# Patient Record
Sex: Male | Born: 1965
Health system: Southern US, Community
[De-identification: ages and names within clinical notes are randomized; demographics above are authoritative.]

## PROBLEM LIST (undated history)

## (undated) DIAGNOSIS — F419 Anxiety disorder, unspecified: Secondary | ICD-10-CM

## (undated) DIAGNOSIS — I10 Essential (primary) hypertension: Secondary | ICD-10-CM

## (undated) DIAGNOSIS — F32A Depression, unspecified: Secondary | ICD-10-CM

## (undated) DIAGNOSIS — E785 Hyperlipidemia, unspecified: Secondary | ICD-10-CM

## (undated) DIAGNOSIS — N390 Urinary tract infection, site not specified: Secondary | ICD-10-CM

## (undated) DIAGNOSIS — E119 Type 2 diabetes mellitus without complications: Secondary | ICD-10-CM

## (undated) DIAGNOSIS — K219 Gastro-esophageal reflux disease without esophagitis: Secondary | ICD-10-CM

## (undated) DIAGNOSIS — F329 Major depressive disorder, single episode, unspecified: Secondary | ICD-10-CM

## (undated) DIAGNOSIS — T7840XA Allergy, unspecified, initial encounter: Secondary | ICD-10-CM

## (undated) HISTORY — DX: Gastro-esophageal reflux disease without esophagitis: K21.9

## (undated) HISTORY — PX: TONSILLECTOMY AND ADENOIDECTOMY: SUR1326

## (undated) HISTORY — DX: Urinary tract infection, site not specified: N39.0

## (undated) HISTORY — DX: Hyperlipidemia, unspecified: E78.5

## (undated) HISTORY — PX: OPEN ANTERIOR SHOULDER RECONSTRUCTION: SHX2100

## (undated) HISTORY — DX: Type 2 diabetes mellitus without complications: E11.9

## (undated) HISTORY — DX: Essential (primary) hypertension: I10

## (undated) HISTORY — DX: Allergy, unspecified, initial encounter: T78.40XA

## (undated) HISTORY — DX: Anxiety disorder, unspecified: F41.9

## (undated) HISTORY — DX: Depression, unspecified: F32.A

---
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
## 1998-02-18 HISTORY — PX: SEPTOPLASTY: SUR1290

## 2008-09-01 ENCOUNTER — Encounter (INDEPENDENT_AMBULATORY_CARE_PROVIDER_SITE_OTHER): Payer: Self-pay | Admitting: Internal Medicine

## 2008-09-01 ENCOUNTER — Ambulatory Visit (HOSPITAL_COMMUNITY): Admission: RE | Admit: 2008-09-01 | Discharge: 2008-09-01 | Payer: Self-pay | Admitting: Internal Medicine

## 2010-06-03 ENCOUNTER — Emergency Department (HOSPITAL_COMMUNITY)
Admission: EM | Admit: 2010-06-03 | Discharge: 2010-06-03 | Disposition: A | Discharge status: Home / Self Care | Payer: PRIVATE HEALTH INSURANCE | Attending: Emergency Medicine | Admitting: Emergency Medicine

## 2010-06-03 ENCOUNTER — Emergency Department (HOSPITAL_COMMUNITY): Payer: PRIVATE HEALTH INSURANCE

## 2010-06-03 DIAGNOSIS — Z79899 Other long term (current) drug therapy: Secondary | ICD-10-CM | POA: Insufficient documentation

## 2010-06-03 DIAGNOSIS — Z7982 Long term (current) use of aspirin: Secondary | ICD-10-CM | POA: Insufficient documentation

## 2010-06-03 DIAGNOSIS — S4980XA Other specified injuries of shoulder and upper arm, unspecified arm, initial encounter: Secondary | ICD-10-CM | POA: Insufficient documentation

## 2010-06-03 DIAGNOSIS — X500XXA Overexertion from strenuous movement or load, initial encounter: Secondary | ICD-10-CM | POA: Insufficient documentation

## 2010-06-03 DIAGNOSIS — Y990 Civilian activity done for income or pay: Secondary | ICD-10-CM | POA: Insufficient documentation

## 2010-06-03 DIAGNOSIS — Y9210 Unspecified residential institution as the place of occurrence of the external cause: Secondary | ICD-10-CM | POA: Insufficient documentation

## 2010-06-03 DIAGNOSIS — M25519 Pain in unspecified shoulder: Secondary | ICD-10-CM | POA: Insufficient documentation

## 2010-06-03 DIAGNOSIS — S46909A Unspecified injury of unspecified muscle, fascia and tendon at shoulder and upper arm level, unspecified arm, initial encounter: Secondary | ICD-10-CM | POA: Insufficient documentation

## 2010-06-03 DIAGNOSIS — I10 Essential (primary) hypertension: Secondary | ICD-10-CM | POA: Insufficient documentation

## 2010-06-03 DIAGNOSIS — IMO0002 Reserved for concepts with insufficient information to code with codable children: Secondary | ICD-10-CM | POA: Insufficient documentation

## 2010-07-03 NOTE — Op Note (Signed)
NAME:  Nicholas Ward, Nicholas Ward NO.:  [id number]   MEDICAL RECORD NO.:  [id number]          PATIENT TYPE:  AMB   LOCATION:  DAY                           FACILITY:  [facility]   PHYSICIAN:  Lionel December, M.D.    DATE OF BIRTH:  1965-12-07   DATE OF PROCEDURE:  09/01/2008  DATE OF DISCHARGE:  09/01/2008                                PROCEDURE NOTE   PROCEDURE:  Colonoscopy.   INDICATION:  Nicholas Ward is a 45 year old Caucasian male who has been  experiencing intermittent hematochezia over the last few months.  He has  not experienced any change in his bowel habits.  Family history is very  significant.  His mother had colon carcinoma at age 64 and had a second  probably 20 years later needing APR.  His father died of carcinoid.  Procedure and risks were reviewed with the patient and informed consent  was obtained.   MEDICATIONS FOR CONSCIOUS SEDATION:  Demerol 50 mg IV and Versed 6 mg IV  in divided doses.   FINDINGS:  Procedure performed in endoscopy suite.  The patient's vital  signs and O2 sats were monitored during the procedure and remained  stable.  The patient was placed in left lateral recumbent position and  rectal examination performed.  No abnormality noted on external or  digital exam.  Pentax videoscope was placed through the rectum and  advanced under vision into sigmoid colon and beyond.  Preparation was  excellent.  The scope was passed into cecum and which was identified by  appendiceal orifice and ileocecal valve.  Short segment of TI was also  examined and it was normal.  Colonic mucosa was carefully examined on  the way out.  There was a small polyp at the descending colon, which was  ablated via cold biopsy.  Mucosa and rest of the colon was normal.  Rectal mucosa similarly was normal.  The scope was retroflexed to  examine anorectal junction, and small hemorrhoids were noted below the  dentate line.  Endoscope was straightened and withdrawn.  Withdrawal  time  was 8 minutes.   The patient tolerated the procedure well.   FINAL DIAGNOSES:  1. Normal terminal ileum.  2. Small polyps ablated via cold biopsy from descending colon.  3. Small external hemorrhoids felt to be the source of intermittent      hematochezia.   RECOMMENDATIONS:  Standard instructions given.   I will be contacting the patient with the results of biopsy.  Given his  family history, he will need to return for his next exam in 5 years.      Lionel December, M.D.  Electronically Signed     NR/MEDQ  D:  09/01/2008  T:  09/02/2008  Job:  [id number]   cc:   Doreen Beam, MD  Fax: [phone]

## 2010-08-31 ENCOUNTER — Other Ambulatory Visit (HOSPITAL_COMMUNITY): Payer: Self-pay | Admitting: Orthopedic Surgery

## 2010-08-31 DIAGNOSIS — R52 Pain, unspecified: Secondary | ICD-10-CM

## 2010-09-04 ENCOUNTER — Other Ambulatory Visit (HOSPITAL_COMMUNITY): Payer: Self-pay | Admitting: Orthopedic Surgery

## 2010-09-04 ENCOUNTER — Other Ambulatory Visit (HOSPITAL_COMMUNITY): Payer: Self-pay

## 2010-09-04 ENCOUNTER — Ambulatory Visit (HOSPITAL_COMMUNITY)
Admission: RE | Admit: 2010-09-04 | Discharge: 2010-09-04 | Disposition: A | Discharge status: Home / Self Care | Payer: PRIVATE HEALTH INSURANCE | Source: Ambulatory Visit | Attending: Orthopedic Surgery | Admitting: Orthopedic Surgery

## 2010-09-04 DIAGNOSIS — R52 Pain, unspecified: Secondary | ICD-10-CM

## 2010-09-04 DIAGNOSIS — M25519 Pain in unspecified shoulder: Secondary | ICD-10-CM | POA: Insufficient documentation
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
## 2010-09-18 ENCOUNTER — Other Ambulatory Visit (HOSPITAL_COMMUNITY): Payer: Self-pay

## 2010-09-20 ENCOUNTER — Encounter (HOSPITAL_COMMUNITY)
Admission: RE | Admit: 2010-09-20 | Discharge: 2010-09-20 | Disposition: A | Discharge status: Home / Self Care | Payer: 59 | Source: Ambulatory Visit | Attending: Orthopedic Surgery | Admitting: Orthopedic Surgery

## 2010-09-20 DIAGNOSIS — Z01818 Encounter for other preprocedural examination: Secondary | ICD-10-CM | POA: Insufficient documentation

## 2010-09-20 LAB — CBC
MCV: 89.1 fL (ref 78.0–100.0)
Platelets: 279 10*3/uL (ref 150–400)
RDW: 12 % (ref 11.5–15.5)
WBC: 7.5 10*3/uL (ref 4.0–10.5)

## 2010-09-20 LAB — BASIC METABOLIC PANEL
Chloride: 100 mEq/L (ref 96–112)
Creatinine, Ser: 0.92 mg/dL (ref 0.50–1.35)
GFR calc Af Amer: >60 mL/min (ref >60)

## 2010-09-20 LAB — SURGICAL PCR SCREEN: MRSA, PCR: POSITIVE — AB

## 2010-09-21 ENCOUNTER — Other Ambulatory Visit (HOSPITAL_COMMUNITY): Payer: Self-pay

## 2010-09-28 ENCOUNTER — Ambulatory Visit (HOSPITAL_COMMUNITY)
Admission: RE | Admit: 2010-09-28 | Discharge: 2010-09-28 | Disposition: A | Discharge status: Home / Self Care | Payer: 59 | Source: Ambulatory Visit | Attending: Orthopedic Surgery | Admitting: Orthopedic Surgery

## 2010-09-28 DIAGNOSIS — S43429A Sprain of unspecified rotator cuff capsule, initial encounter: Secondary | ICD-10-CM | POA: Insufficient documentation

## 2010-09-28 DIAGNOSIS — M25819 Other specified joint disorders, unspecified shoulder: Secondary | ICD-10-CM | POA: Insufficient documentation

## 2010-09-28 DIAGNOSIS — S46919A Strain of unspecified muscle, fascia and tendon at shoulder and upper arm level, unspecified arm, initial encounter: Secondary | ICD-10-CM | POA: Insufficient documentation

## 2010-09-28 DIAGNOSIS — I10 Essential (primary) hypertension: Secondary | ICD-10-CM | POA: Insufficient documentation

## 2010-09-28 DIAGNOSIS — S46819A Strain of other muscles, fascia and tendons at shoulder and upper arm level, unspecified arm, initial encounter: Secondary | ICD-10-CM | POA: Insufficient documentation

## 2010-09-28 DIAGNOSIS — M19019 Primary osteoarthritis, unspecified shoulder: Secondary | ICD-10-CM | POA: Insufficient documentation

## 2010-09-28 DIAGNOSIS — M24119 Other articular cartilage disorders, unspecified shoulder: Secondary | ICD-10-CM | POA: Insufficient documentation
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
## 2013-08-24 ENCOUNTER — Encounter (INDEPENDENT_AMBULATORY_CARE_PROVIDER_SITE_OTHER): Payer: Self-pay | Admitting: *Deleted

## 2014-04-21 ENCOUNTER — Encounter: Payer: Self-pay | Admitting: Family

## 2014-04-21 ENCOUNTER — Ambulatory Visit (INDEPENDENT_AMBULATORY_CARE_PROVIDER_SITE_OTHER): Payer: 59 | Admitting: Family

## 2014-04-21 ENCOUNTER — Other Ambulatory Visit (INDEPENDENT_AMBULATORY_CARE_PROVIDER_SITE_OTHER): Payer: 59

## 2014-04-21 VITALS — BP 140/92 | HR 62 | Temp 98.0°F | Resp 18 | Ht 69.0 in | Wt 193.8 lb

## 2014-04-21 DIAGNOSIS — F418 Other specified anxiety disorders: Secondary | ICD-10-CM

## 2014-04-21 DIAGNOSIS — I10 Essential (primary) hypertension: Secondary | ICD-10-CM | POA: Insufficient documentation

## 2014-04-21 DIAGNOSIS — E119 Type 2 diabetes mellitus without complications: Secondary | ICD-10-CM

## 2014-04-21 DIAGNOSIS — E785 Hyperlipidemia, unspecified: Secondary | ICD-10-CM

## 2014-04-21 DIAGNOSIS — Z716 Tobacco abuse counseling: Secondary | ICD-10-CM

## 2014-04-21 DIAGNOSIS — F329 Major depressive disorder, single episode, unspecified: Secondary | ICD-10-CM

## 2014-04-21 DIAGNOSIS — F419 Anxiety disorder, unspecified: Secondary | ICD-10-CM

## 2014-04-21 LAB — LIPID PANEL
CHOL/HDL RATIO: 4
CHOLESTEROL: 173 mg/dL (ref 0–200)
HDL: 42.6 mg/dL (ref >39.00)
LDL Cholesterol: 100 mg/dL — ABNORMAL HIGH (ref 0–99)
NonHDL: 130.4
TRIGLYCERIDES: 154 mg/dL — AB (ref 0.0–149.0)
VLDL: 30.8 mg/dL (ref 0.0–40.0)

## 2014-04-21 LAB — BASIC METABOLIC PANEL
BUN: 12 mg/dL (ref 6–23)
CHLORIDE: 103 meq/L (ref 96–112)
CO2: 29 mEq/L (ref 19–32)
Calcium: 9.3 mg/dL (ref 8.4–10.5)
Creatinine, Ser: 0.91 mg/dL (ref 0.40–1.50)
GFR: 95.12 mL/min (ref >60.00)
Glucose, Bld: 125 mg/dL — ABNORMAL HIGH (ref 70–99)
POTASSIUM: 4.9 meq/L (ref 3.5–5.1)
SODIUM: 137 meq/L (ref 135–145)

## 2014-04-21 LAB — TESTOSTERONE: TESTOSTERONE: 347.15 ng/dL (ref 300.00–890.00)

## 2014-04-21 LAB — HEMOGLOBIN A1C: Hgb A1c MFr Bld: 5.9 % (ref 4.6–6.5)

## 2014-04-21 MED ORDER — BUPROPION HCL ER (SR) 150 MG PO TB12: ORAL_TABLET | ORAL | Status: DC

## 2014-04-21 MED ORDER — ALPRAZOLAM 0.25 MG PO TABS: 0.2500 mg | ORAL_TABLET | Freq: Two times a day (BID) | ORAL | Status: DC | PRN

## 2014-04-21 NOTE — Assessment & Plan Note (Signed)
Stable with no medication at this time. Continue management with current lifestyle behaviors.

## 2014-04-21 NOTE — Assessment & Plan Note (Signed)
Currently maintained with supplements. Obtain new lipid profile. Continue current dosage of supplements. Changes pending lab results.

## 2014-04-21 NOTE — Progress Notes (Signed)
Pre visit review using our clinic review tool, if applicable. No additional management support is needed unless otherwise documented below in the visit note. 

## 2014-04-21 NOTE — Assessment & Plan Note (Addendum)
Previous A1c was 5.6. Obtain new A1c. Discussed preventative tests including eye exams, foot exams, and kidney function checked. Appears well controlled at this time. Follow-up pending A1c results. Patient also notes some mild erectile dysfunction. Obtain testosterone to rule out hormonal cause.

## 2014-04-21 NOTE — Assessment & Plan Note (Addendum)
Patient describes experiencing anxiety and depression with increased episodes lately. He has noted some improvements without medication, however he would like some medication to assist in his depression and anxiety. Start Wellbutrin. Start Xanax as needed for anxiety. Denies any suicidal ideations. Follow-up in one month

## 2014-04-21 NOTE — Assessment & Plan Note (Signed)
Discussed various options for quitting using the vague and all tobacco. Patient wishes to pursue Wellbutrin to supplement. Start Wellbutrin. Patient instructed to take 150 mg once daily for 3 days and then increase to twice daily for 7-12 weeks. He will stop all tobacco or nicotine in approximately one week from starting date.

## 2014-04-21 NOTE — Patient Instructions (Signed)
Thank you for choosing [facility].  Summary/Instructions:  Your prescription(s) have been submitted to your pharmacy or been printed and provided for you. Please take as directed and contact our office if you believe you are having problem(s) with the medication(s) or have any questions.  Please stop by the lab on the basement level of the building for your blood work. Your results will be released to [facility] (or called to you) after review, usually within 72 hours after test completion. If any changes need to be made, you will be notified at that same time.  If your symptoms worsen or fail to improve, please contact our office for further instruction, or in case of emergency go directly to the emergency room at the closest medical facility.    Bupropion extended-release tablets (Depression/Mood Disorders) What is this medicine? BUPROPION (byoo PROE pee on) is used to treat depression. This medicine may be used for other purposes; ask your health care provider or pharmacist if you have questions. COMMON BRAND NAME(S): Aplenzin, Budeprion XL, Forfivo XL, Wellbutrin XL What should I tell my health care provider before I take this medicine? They need to know if you have any of these conditions: -an eating disorder, such as anorexia or bulimia -bipolar disorder or psychosis -diabetes or high blood sugar, treated with medication -glaucoma -head injury or brain tumor -heart disease, previous heart attack, or irregular heart beat -high blood pressure -kidney or liver disease -seizures (convulsions) -suicidal thoughts or a previous suicide attempt -Tourette's syndrome -weight loss -an unusual or allergic reaction to bupropion, other medicines, foods, dyes, or preservatives -breast-feeding -pregnant or trying to become pregnant How should I use this medicine? Take this medicine by mouth with a glass of water. Follow the directions on the prescription label. You can take it with or  without food. If it upsets your stomach, take it with food. Do not crush, chew, or cut these tablets. This medicine is taken once daily at the same time each day. Do not take your medicine more often than directed. Do not stop taking this medicine suddenly except upon the advice of your doctor. Stopping this medicine too quickly may cause serious side effects or your condition may worsen. A special MedGuide will be given to you by the pharmacist with each prescription and refill. Be sure to read this information carefully each time. Talk to your pediatrician regarding the use of this medicine in children. Special care may be needed. Overdosage: If you think you have taken too much of this medicine contact a poison control center or emergency room at once. NOTE: This medicine is only for you. Do not share this medicine with others. What if I miss a dose? If you miss a dose, skip the missed dose and take your next tablet at the regular time. Do not take double or extra doses. What may interact with this medicine? Do not take this medicine with any of the following medications: -linezolid -MAOIs like Azilect, Carbex, Eldepryl, Marplan, Nardil, and Parnate -methylene blue (injected into a vein) -other medicines that contain bupropion like Zyban This medicine may also interact with the following medications: -alcohol -certain medicines for anxiety or sleep -certain medicines for blood pressure like metoprolol, propranolol -certain medicines for depression or psychotic disturbances -certain medicines for HIV or AIDS like efavirenz, lopinavir, nelfinavir, ritonavir -certain medicines for irregular heart beat like propafenone, flecainide -certain medicines for Parkinson's disease like amantadine, levodopa -certain medicines for seizures like carbamazepine, phenytoin, phenobarbital -cimetidine -clopidogrel -cyclophosphamide -furazolidone -isoniazid -  nicotine -orphenadrine -procarbazine -steroid  medicines like prednisone or cortisone -stimulant medicines for attention disorders, weight loss, or to stay awake -tamoxifen -theophylline -thiotepa -ticlopidine -tramadol -warfarin This list may not describe all possible interactions. Give your health care provider a list of all the medicines, herbs, non-prescription drugs, or dietary supplements you use. Also tell them if you smoke, drink alcohol, or use illegal drugs. Some items may interact with your medicine. What should I watch for while using this medicine? Tell your doctor if your symptoms do not get better or if they get worse. Visit your doctor or health care professional for regular checks on your progress. Because it may take several weeks to see the full effects of this medicine, it is important to continue your treatment as prescribed by your doctor. Patients and their families should watch out for new or worsening thoughts of suicide or depression. Also watch out for sudden changes in feelings such as feeling anxious, agitated, panicky, irritable, hostile, aggressive, impulsive, severely restless, overly excited and hyperactive, or not being able to sleep. If this happens, especially at the beginning of treatment or after a change in dose, call your health care professional. Avoid alcoholic drinks while taking this medicine. Drinking large amounts of alcoholic beverages, using sleeping or anxiety medicines, or quickly stopping the use of these agents while taking this medicine may increase your risk for a seizure. Do not drive or use heavy machinery until you know how this medicine affects you. This medicine can impair your ability to perform these tasks. Do not take this medicine close to bedtime. It may prevent you from sleeping. Your mouth may get dry. Chewing sugarless gum or sucking hard candy, and drinking plenty of water may help. Contact your doctor if the problem does not go away or is severe. The tablet shell for some brands  of this medicine does not dissolve. This is normal. The tablet shell may appear whole in the stool. This is not a cause for concern. What side effects may I notice from receiving this medicine? Side effects that you should report to your doctor or health care professional as soon as possible: -allergic reactions like skin rash, itching or hives, swelling of the face, lips, or tongue -breathing problems -changes in vision -confusion -fast or irregular heartbeat -hallucinations -increased blood pressure -redness, blistering, peeling or loosening of the skin, including inside the mouth -seizures -suicidal thoughts or other mood changes -unusually weak or tired -vomiting Side effects that usually do not require medical attention (report to your doctor or health care professional if they continue or are bothersome): -change in sex drive or performance -constipation -headache -loss of appetite -nausea -tremors -weight loss This list may not describe all possible side effects. Call your doctor for medical advice about side effects. You may report side effects to FDA at 1-800-FDA-1088. Where should I keep my medicine? Keep out of the reach of children. Store at room temperature between 15 and 30 degrees C (59 and 86 degrees F). Throw away any unused medicine after the expiration date. NOTE: This sheet is a summary. It may not cover all possible information. If you have questions about this medicine, talk to your doctor, pharmacist, or health care provider.  2015, Elsevier/Gold Standard. (2012-08-28 12:39:42)

## 2014-04-21 NOTE — Progress Notes (Signed)
Subjective:    Patient ID: Nicholas Ward, male    DOB: 06-28-1965, 49 y.o.   MRN: [id number]  Chief Complaint  Patient presents with  . Establish Care    HPI:  Nicholas Ward is a 49 y.o. male who presents today to establish care and discuss    1) Depression - Associated symptom of depressed mood has been going on for a couple of years, with an exacerbation in January when his wife was admitted to the hospital. Indicates that he has family and work concerns. Currently seeing a counselor regarding relationship.   2) Diabetes - Previously diagnosed and not currently treated with any medication. Previous A1c was 5.6 at an outside facility. Goal was to work on lifestyle changes. Last eye exam up to date.   3) Hyperlipidemia - Indicates his cholesterol was labile and was going to start a statin. Currently treated with red yeast rice and fish oil.   4) Tobacco use - interested in quitting smoking.    No Known Allergies   No current outpatient prescriptions on file prior to visit.   No current facility-administered medications on file prior to visit.    Past Medical History  Diagnosis Date  . Hypertension   . Diabetes mellitus without complication   . Hyperlipidemia   . Urinary tract infection     Past Surgical History  Procedure Laterality Date  . Tonsillectomy and adenoidectomy    . Open anterior shoulder reconstruction    . Septoplasty  2000    Family History  Problem Relation Age of Onset  . Colon cancer Mother   . Hyperlipidemia Mother   . Heart disease Father   . Hypertension Father   . Diabetes Father   . Lung cancer Maternal Grandmother   . Alcohol abuse Maternal Grandfather   . Alcohol abuse Paternal Grandfather     History   Social History  . Marital Status: Married    Spouse Name: N/A  . Number of Children: 2  . Years of Education: 16   Occupational History  . Registered Nurse    Social History Main Topics  . Smoking status: Former  Smoker -- 0.25 packs/day for 18 years    Types: Cigarettes  . Smokeless tobacco: Never Used  . Alcohol Use: No  . Drug Use: No  . Sexual Activity: Not on file   Other Topics Concern  . Not on file   Social History Narrative   Currently lives with his wife and children. 3 cats, 2 dogs; Fun: martial arts, motorcycle   Denies any religious beliefs effecting healthcare.     Review of Systems  Eyes:       Negative for changes in vision.  Respiratory: Negative for chest tightness and shortness of breath.   Cardiovascular: Negative for chest pain, palpitations and leg swelling.  Psychiatric/Behavioral: Positive for sleep disturbance and dysphoric mood. Negative for suicidal ideas. The patient is nervous/anxious.       Objective:    BP 140/92 mmHg  Pulse 62  Temp(Src) 98 F (36.7 C) (Oral)  Resp 18  Ht 5\' 9"  (1.753 m)  Wt 193 lb 12.8 oz (87.907 kg)  BMI 28.61 kg/m2  SpO2 98% Nursing note and vital signs reviewed.  Physical Exam  Constitutional: He is oriented to person, place, and time. He appears well-developed and well-nourished. No distress.  Cardiovascular: Normal rate, regular rhythm, normal heart sounds and intact distal pulses.   Pulmonary/Chest: Effort normal and breath sounds normal.  Neurological: He is alert and oriented to person, place, and time.  Skin: Skin is warm and dry.  Psychiatric: He has a normal mood and affect. His behavior is normal. Judgment and thought content normal.       Assessment & Plan:

## 2014-04-22 ENCOUNTER — Telehealth: Payer: Self-pay | Admitting: Family

## 2014-04-22 ENCOUNTER — Encounter: Payer: Self-pay | Admitting: Family

## 2014-04-22 NOTE — Telephone Encounter (Signed)
Pt aware of results. Will send them in the mail

## 2014-04-22 NOTE — Telephone Encounter (Signed)
Please inform the patient that his lab work looks good. All labs were within the normal limits including testosterone.   Lab Results  Component Value Date   HGBA1C 5.9 04/21/2014   Lab Results  Component Value Date   CHOL 173 04/21/2014   HDL 42.60 04/21/2014   LDLCALC 100* 04/21/2014   TRIG 154.0* 04/21/2014   CHOLHDL 4 04/21/2014

## 2014-05-17 ENCOUNTER — Ambulatory Visit: Payer: Self-pay | Admitting: Family

## 2014-07-07 ENCOUNTER — Other Ambulatory Visit: Payer: Self-pay | Admitting: Family

## 2014-07-07 MED ORDER — ALPRAZOLAM 0.25 MG PO TABS: 0.2500 mg | ORAL_TABLET | Freq: Two times a day (BID) | ORAL | Status: DC | PRN

## 2014-07-07 NOTE — Addendum Note (Signed)
Addended by: Mauricio Po D on: 07/07/2014 12:49 PM   Modules accepted: Orders

## 2014-07-14 MED ORDER — BUPROPION HCL ER (SR) 150 MG PO TB12: 150.0000 mg | ORAL_TABLET | Freq: Two times a day (BID) | ORAL | Status: DC

## 2014-07-14 NOTE — Addendum Note (Signed)
Addended by: Earnstine Regal on: 07/14/2014 10:39 AM   Modules accepted: Orders

## 2014-11-02 ENCOUNTER — Other Ambulatory Visit: Payer: Self-pay | Admitting: Family

## 2014-11-02 MED ORDER — ALPRAZOLAM 0.25 MG PO TABS: 0.2500 mg | ORAL_TABLET | Freq: Two times a day (BID) | ORAL | Status: DC | PRN

## 2014-11-02 MED ORDER — BUPROPION HCL ER (SR) 150 MG PO TB12: 150.0000 mg | ORAL_TABLET | Freq: Two times a day (BID) | ORAL | Status: DC

## 2014-11-04 ENCOUNTER — Other Ambulatory Visit: Payer: Self-pay

## 2015-01-18 ENCOUNTER — Other Ambulatory Visit: Payer: Self-pay | Admitting: Family

## 2015-01-18 MED ORDER — BUPROPION HCL ER (SR) 150 MG PO TB12: 150.0000 mg | ORAL_TABLET | Freq: Two times a day (BID) | ORAL | Status: DC

## 2015-01-18 MED ORDER — ALPRAZOLAM 0.25 MG PO TABS: 0.2500 mg | ORAL_TABLET | Freq: Two times a day (BID) | ORAL | Status: DC | PRN

## 2015-01-18 NOTE — Addendum Note (Signed)
Addended by: Mauricio Po D on: 01/18/2015 09:54 PM   Modules accepted: Orders

## 2015-03-21 ENCOUNTER — Other Ambulatory Visit: Payer: Self-pay | Admitting: Family

## 2015-03-21 MED ORDER — BUPROPION HCL ER (SR) 150 MG PO TB12: 150.0000 mg | ORAL_TABLET | Freq: Two times a day (BID) | ORAL | Status: DC

## 2015-03-21 NOTE — Addendum Note (Signed)
Addended by: Earnstine Regal on: 03/21/2015 03:36 PM   Modules accepted: Orders

## 2015-03-31 DIAGNOSIS — E1165 Type 2 diabetes mellitus with hyperglycemia: Secondary | ICD-10-CM | POA: Diagnosis not present

## 2015-08-03 ENCOUNTER — Other Ambulatory Visit: Payer: Self-pay | Admitting: Family

## 2015-08-03 NOTE — Addendum Note (Signed)
Addended by: Biagio Borg on: 08/03/2015 05:28 PM   Modules accepted: Orders

## 2015-08-03 NOTE — Telephone Encounter (Signed)
Ok to forward to PCP 

## 2015-08-04 MED ORDER — BUPROPION HCL ER (SR) 150 MG PO TB12: 150.0000 mg | ORAL_TABLET | Freq: Two times a day (BID) | ORAL | Status: DC

## 2015-08-04 MED ORDER — ALPRAZOLAM 0.25 MG PO TABS: 0.2500 mg | ORAL_TABLET | Freq: Two times a day (BID) | ORAL | Status: DC | PRN

## 2015-08-04 NOTE — Addendum Note (Signed)
Addended by: Mauricio Po D on: 08/04/2015 10:55 AM   Modules accepted: Orders

## 2015-08-08 DIAGNOSIS — E1165 Type 2 diabetes mellitus with hyperglycemia: Secondary | ICD-10-CM | POA: Diagnosis not present

## 2015-11-29 ENCOUNTER — Encounter: Payer: Self-pay | Admitting: Family

## 2015-11-29 ENCOUNTER — Other Ambulatory Visit: Payer: Self-pay | Admitting: Family

## 2015-11-29 MED ORDER — ALPRAZOLAM 0.25 MG PO TABS: 0.2500 mg | ORAL_TABLET | Freq: Two times a day (BID) | ORAL | 0 refills | Status: DC | PRN

## 2015-12-04 ENCOUNTER — Other Ambulatory Visit: Payer: Self-pay | Admitting: Family

## 2015-12-07 NOTE — Telephone Encounter (Signed)
Rec'd fax requesting refilll on alprazolam. Per chart med was APPROVED FAXED BACK ON 10/11. Called pharmacy spokw w/susan advise if refill was received on 10/11, and which she states they did not received. Gave verbal request from 10/11 to fill alprazolam.../lmb

## 2016-02-05 DIAGNOSIS — Z299 Encounter for prophylactic measures, unspecified: Secondary | ICD-10-CM | POA: Diagnosis not present

## 2016-02-05 DIAGNOSIS — Z713 Dietary counseling and surveillance: Secondary | ICD-10-CM | POA: Diagnosis not present

## 2016-02-05 DIAGNOSIS — Z6831 Body mass index (BMI) 31.0-31.9, adult: Secondary | ICD-10-CM | POA: Diagnosis not present

## 2016-02-05 DIAGNOSIS — I10 Essential (primary) hypertension: Secondary | ICD-10-CM | POA: Diagnosis not present

## 2016-02-05 DIAGNOSIS — M549 Dorsalgia, unspecified: Secondary | ICD-10-CM | POA: Diagnosis not present

## 2016-03-22 ENCOUNTER — Encounter: Payer: Self-pay | Admitting: Internal Medicine

## 2016-07-31 DIAGNOSIS — Z299 Encounter for prophylactic measures, unspecified: Secondary | ICD-10-CM | POA: Diagnosis not present

## 2016-07-31 DIAGNOSIS — W57XXXA Bitten or stung by nonvenomous insect and other nonvenomous arthropods, initial encounter: Secondary | ICD-10-CM | POA: Diagnosis not present

## 2016-07-31 DIAGNOSIS — Z6828 Body mass index (BMI) 28.0-28.9, adult: Secondary | ICD-10-CM | POA: Diagnosis not present

## 2016-07-31 DIAGNOSIS — F321 Major depressive disorder, single episode, moderate: Secondary | ICD-10-CM | POA: Diagnosis not present

## 2016-07-31 DIAGNOSIS — Z713 Dietary counseling and surveillance: Secondary | ICD-10-CM | POA: Diagnosis not present

## 2016-07-31 DIAGNOSIS — E1165 Type 2 diabetes mellitus with hyperglycemia: Secondary | ICD-10-CM | POA: Diagnosis not present

## 2016-07-31 DIAGNOSIS — I10 Essential (primary) hypertension: Secondary | ICD-10-CM | POA: Diagnosis not present

## 2016-07-31 DIAGNOSIS — E7800 Pure hypercholesterolemia, unspecified: Secondary | ICD-10-CM | POA: Diagnosis not present

## 2016-10-24 DIAGNOSIS — J069 Acute upper respiratory infection, unspecified: Secondary | ICD-10-CM | POA: Diagnosis not present

## 2016-10-24 DIAGNOSIS — Z299 Encounter for prophylactic measures, unspecified: Secondary | ICD-10-CM | POA: Diagnosis not present

## 2016-10-24 DIAGNOSIS — E1165 Type 2 diabetes mellitus with hyperglycemia: Secondary | ICD-10-CM | POA: Diagnosis not present

## 2016-10-24 DIAGNOSIS — Z6828 Body mass index (BMI) 28.0-28.9, adult: Secondary | ICD-10-CM | POA: Diagnosis not present

## 2016-10-24 DIAGNOSIS — M549 Dorsalgia, unspecified: Secondary | ICD-10-CM | POA: Diagnosis not present

## 2016-11-06 DIAGNOSIS — F321 Major depressive disorder, single episode, moderate: Secondary | ICD-10-CM | POA: Diagnosis not present

## 2016-11-06 DIAGNOSIS — M5416 Radiculopathy, lumbar region: Secondary | ICD-10-CM | POA: Diagnosis not present

## 2016-11-06 DIAGNOSIS — Z299 Encounter for prophylactic measures, unspecified: Secondary | ICD-10-CM | POA: Diagnosis not present

## 2016-11-06 DIAGNOSIS — Z6829 Body mass index (BMI) 29.0-29.9, adult: Secondary | ICD-10-CM | POA: Diagnosis not present

## 2016-11-06 DIAGNOSIS — E7800 Pure hypercholesterolemia, unspecified: Secondary | ICD-10-CM | POA: Diagnosis not present

## 2016-11-06 DIAGNOSIS — E1165 Type 2 diabetes mellitus with hyperglycemia: Secondary | ICD-10-CM | POA: Diagnosis not present

## 2016-11-06 DIAGNOSIS — I10 Essential (primary) hypertension: Secondary | ICD-10-CM | POA: Diagnosis not present

## 2016-11-08 ENCOUNTER — Other Ambulatory Visit (HOSPITAL_COMMUNITY): Payer: Self-pay | Admitting: Internal Medicine

## 2016-11-08 DIAGNOSIS — M545 Low back pain: Secondary | ICD-10-CM

## 2016-11-08 DIAGNOSIS — M5416 Radiculopathy, lumbar region: Secondary | ICD-10-CM

## 2016-11-13 ENCOUNTER — Ambulatory Visit (HOSPITAL_COMMUNITY)
Admission: RE | Admit: 2016-11-13 | Discharge: 2016-11-13 | Disposition: A | Discharge status: Home / Self Care | Payer: 59 | Source: Ambulatory Visit | Attending: Internal Medicine | Admitting: Internal Medicine

## 2016-11-13 DIAGNOSIS — M7989 Other specified soft tissue disorders: Secondary | ICD-10-CM | POA: Insufficient documentation

## 2016-11-13 DIAGNOSIS — M545 Low back pain: Secondary | ICD-10-CM | POA: Diagnosis not present

## 2016-11-13 DIAGNOSIS — M5126 Other intervertebral disc displacement, lumbar region: Secondary | ICD-10-CM | POA: Insufficient documentation

## 2016-11-13 DIAGNOSIS — M5416 Radiculopathy, lumbar region: Secondary | ICD-10-CM

## 2016-11-13 DIAGNOSIS — M48061 Spinal stenosis, lumbar region without neurogenic claudication: Secondary | ICD-10-CM | POA: Insufficient documentation

## 2016-11-13 DIAGNOSIS — M2578 Osteophyte, vertebrae: Secondary | ICD-10-CM | POA: Diagnosis not present

## 2016-11-25 DIAGNOSIS — F321 Major depressive disorder, single episode, moderate: Secondary | ICD-10-CM | POA: Diagnosis not present

## 2016-11-25 DIAGNOSIS — E1165 Type 2 diabetes mellitus with hyperglycemia: Secondary | ICD-10-CM | POA: Diagnosis not present

## 2016-11-25 DIAGNOSIS — Z299 Encounter for prophylactic measures, unspecified: Secondary | ICD-10-CM | POA: Diagnosis not present

## 2016-11-25 DIAGNOSIS — Z6828 Body mass index (BMI) 28.0-28.9, adult: Secondary | ICD-10-CM | POA: Diagnosis not present

## 2016-11-25 DIAGNOSIS — M5416 Radiculopathy, lumbar region: Secondary | ICD-10-CM | POA: Diagnosis not present

## 2016-11-25 DIAGNOSIS — M549 Dorsalgia, unspecified: Secondary | ICD-10-CM | POA: Diagnosis not present

## 2016-11-29 DIAGNOSIS — M5126 Other intervertebral disc displacement, lumbar region: Secondary | ICD-10-CM | POA: Diagnosis not present

## 2017-01-25 DIAGNOSIS — J019 Acute sinusitis, unspecified: Secondary | ICD-10-CM | POA: Diagnosis not present

## 2017-03-18 DIAGNOSIS — J069 Acute upper respiratory infection, unspecified: Secondary | ICD-10-CM | POA: Diagnosis not present

## 2017-03-18 DIAGNOSIS — E7800 Pure hypercholesterolemia, unspecified: Secondary | ICD-10-CM | POA: Diagnosis not present

## 2017-03-18 DIAGNOSIS — F321 Major depressive disorder, single episode, moderate: Secondary | ICD-10-CM | POA: Diagnosis not present

## 2017-03-18 DIAGNOSIS — Z299 Encounter for prophylactic measures, unspecified: Secondary | ICD-10-CM | POA: Diagnosis not present

## 2017-03-18 DIAGNOSIS — Z6831 Body mass index (BMI) 31.0-31.9, adult: Secondary | ICD-10-CM | POA: Diagnosis not present

## 2017-03-18 DIAGNOSIS — E1165 Type 2 diabetes mellitus with hyperglycemia: Secondary | ICD-10-CM | POA: Diagnosis not present

## 2017-03-18 DIAGNOSIS — I10 Essential (primary) hypertension: Secondary | ICD-10-CM | POA: Diagnosis not present

## 2017-03-26 DIAGNOSIS — H5203 Hypermetropia, bilateral: Secondary | ICD-10-CM | POA: Diagnosis not present

## 2017-03-26 DIAGNOSIS — H524 Presbyopia: Secondary | ICD-10-CM | POA: Diagnosis not present

## 2017-03-26 DIAGNOSIS — H52223 Regular astigmatism, bilateral: Secondary | ICD-10-CM | POA: Diagnosis not present

## 2017-03-26 DIAGNOSIS — E119 Type 2 diabetes mellitus without complications: Secondary | ICD-10-CM | POA: Diagnosis not present

## 2018-02-19 IMAGING — MR MR LUMBAR SPINE W/O CM
4 of 5 series · 14 of 48 positions shown · non-contrast
Comparison: None.

CLINICAL DATA: Ischial evaluation for low back pain radiating down
left leg.

EXAM:
MRI LUMBAR SPINE WITHOUT CONTRAST
TECHNIQUE: Multiplanar, multisequence MR imaging of the lumbar spine was
performed. No intravenous contrast was administered.

[Series 3: T2 · sagittal · 4.0mm · 0.73mm/px · 5 of 17 slices shown (1 of 2)]
[im 1/17]
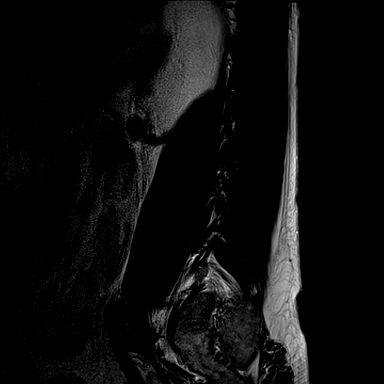
[im 3/17]
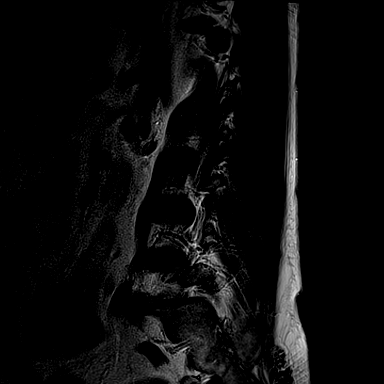
[im 6/17]
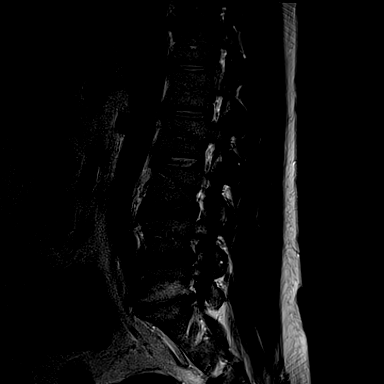
[im 9/17]
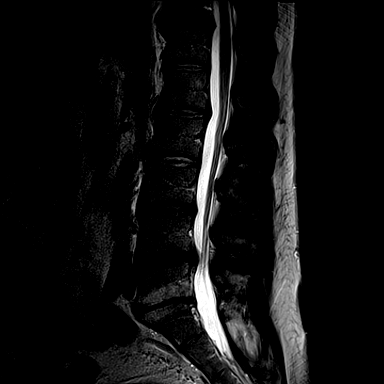
[im 14/17]
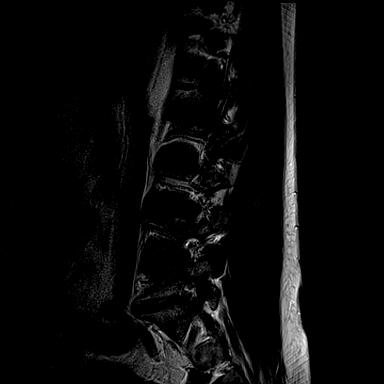

[Series 4: T1 · sagittal · 4.0mm · 0.37mm/px · 3 of 15 slices shown (1 of 2)]
[im 1/15]
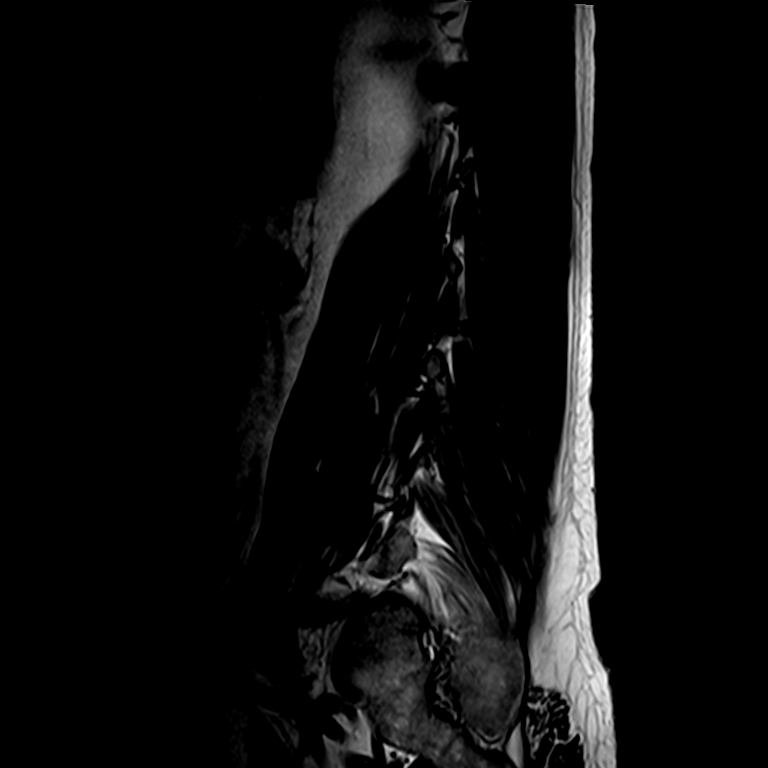
[im 8/15]
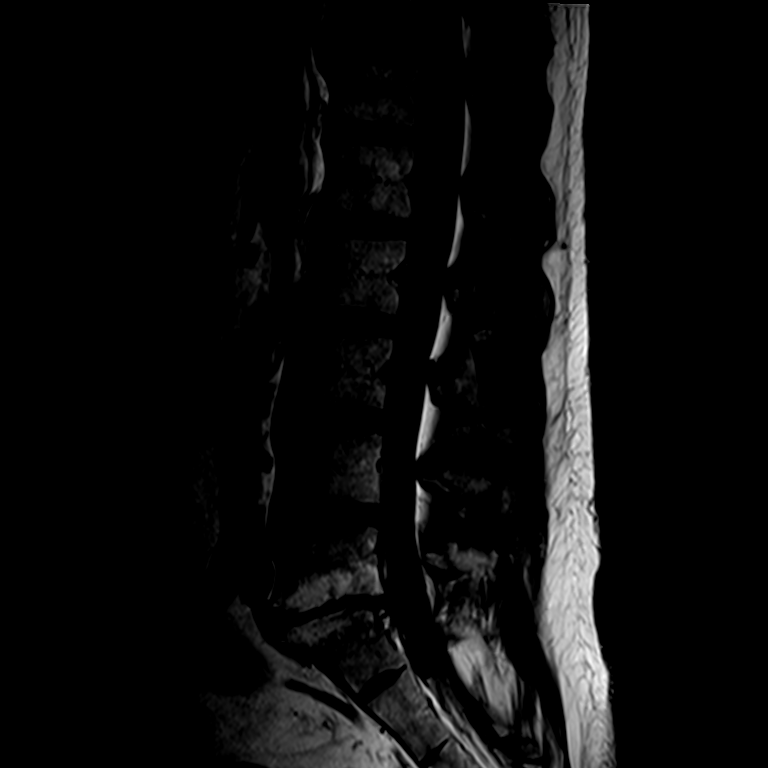
[im 15/15]
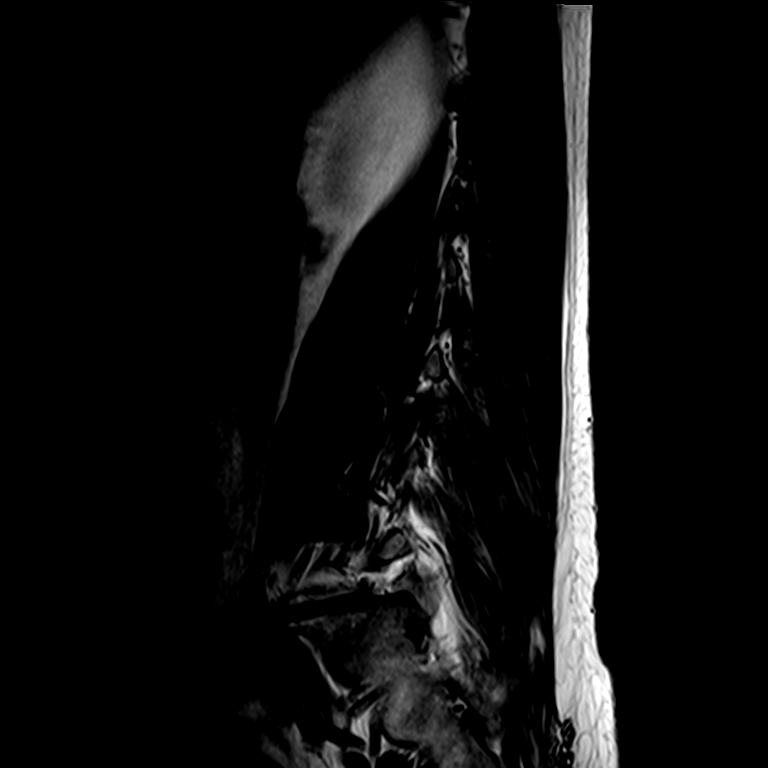

[Series 6: T2 · axial · 4.0mm · 0.24mm/px · z∈[-19,+126]mm · 3 of 43 slices shown (2 of 2)]
[im 7/43]
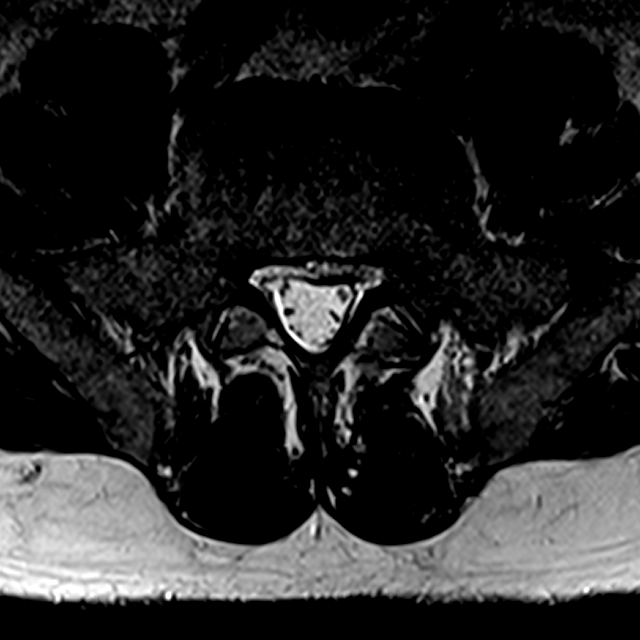
[im 22/43]
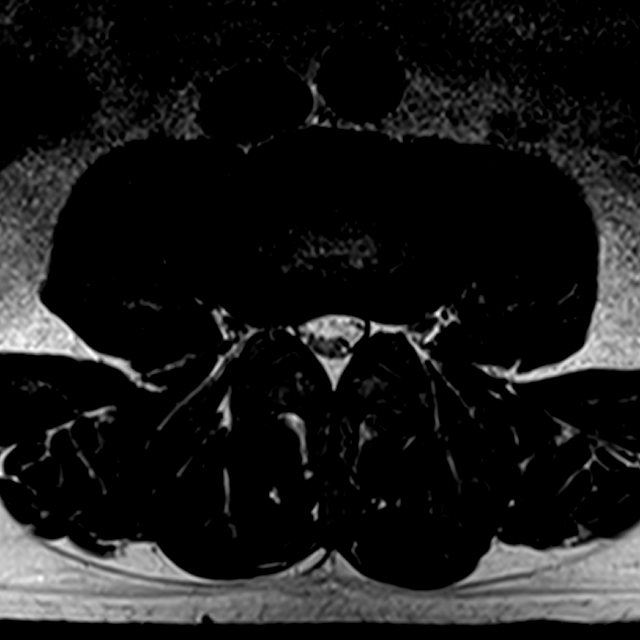
[im 37/43]
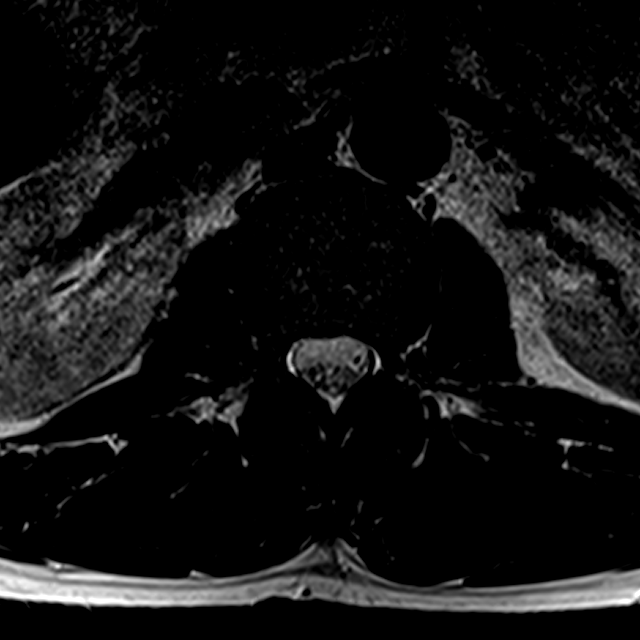

[Series 7: T1 · axial · 4.0mm · 0.23mm/px · z∈[-19,+127]mm · 3 of 43 slices shown (2 of 2)]
[im 7/43]
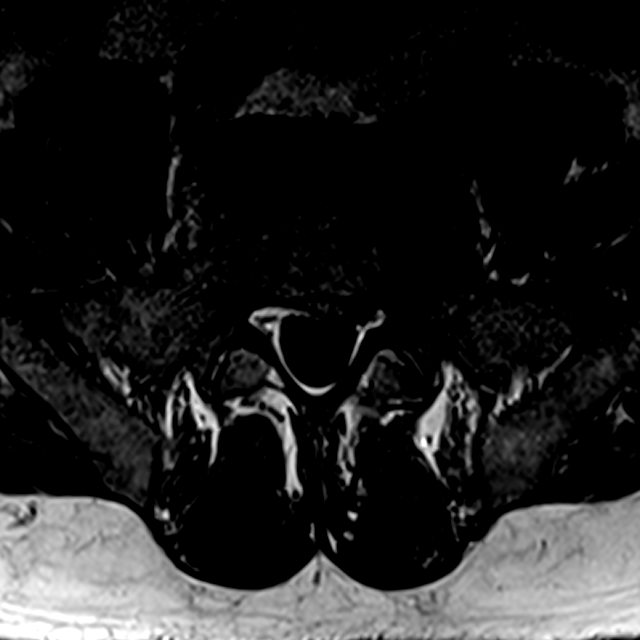
[im 22/43]
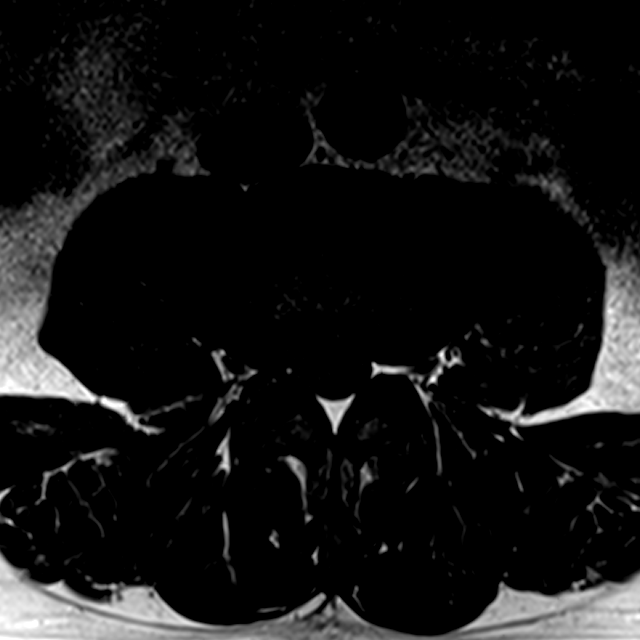
[im 37/43]
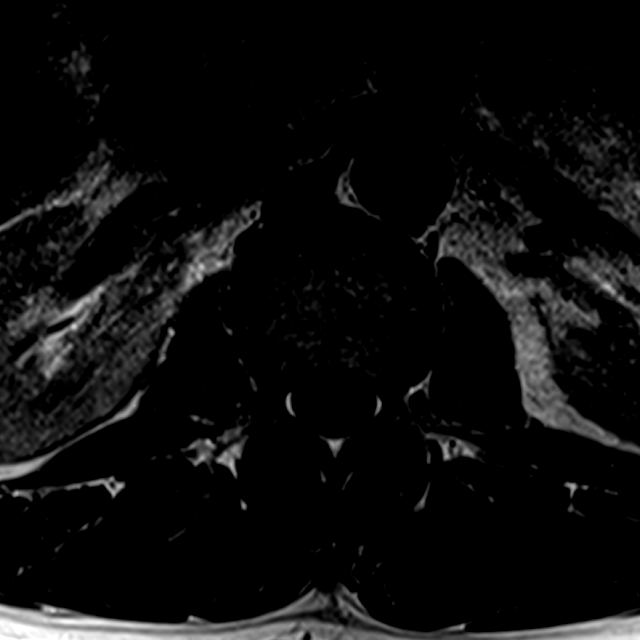

[14 of 48 positions shown; findings below may reference images not displayed]

FINDINGS: Segmentation: Normal segmentation. Lowest well-formed disc labeled
the L5-S1 level.

Alignment: Straightening of the normal lumbar lordosis. Trace
retrolisthesis of L5 on S1.

Vertebrae: Vertebral body heights maintained. No acute or chronic
fracture. Prominent reactive endplate changes about the L5-S1
interspace. Small endplate Schmorl's node noted at the inferior
endplate of L1. Marrow signal intensity within normal limits. No
discrete or worrisome osseous lesions.

Conus medullaris: Extends to the L1 level and appears normal.

Paraspinal and other soft tissues: Mild paraspinous edema within the
lower paraspinous musculature. Paraspinous soft tissues otherwise
within normal limits. Visualized visceral structures normal.

Disc levels:

L1-2: Mild diffuse disc bulge. Superimposed right paracentral disc
protrusion indents the ventral thecal sac. No stenosis or neural
impingement.

L2-3:  Unremarkable.

L3-4: Diffuse disc bulge with disc desiccation. Superimposed right
foraminal disc protrusion contacts the exiting right L3 nerve root
in the right neural foramen (series 4, image 4). Mild facet and
ligamentum flavum hypertrophy. Mild right lateral recess narrowing
without canal stenosis.

L4-5: Diffuse disc bulge with disc desiccation. Superimposed left
subarticular disc protrusion extends into the left lateral recess,
impinging upon the descending left L5 nerve root (series 6, image
29). Disc bulge also extends laterally into the left neural foramen,
potentially affecting the left L4 nerve root as well (series 4,
image 12). Mild to moderate canal stenosis with moderate left
foraminal narrowing.

L5-S1: Diffuse disc bulge with intervertebral disc space narrowing,
disc desiccation, and chronic reactive endplate changes. Mild facet
hypertrophy. No significant canal stenosis. Mild right with moderate
left L5 foraminal narrowing related to disc osteophyte and facet
disease.
IMPRESSION: 1. Left subarticular disc protrusion at L4-5 impinging upon the
descending left L5 nerve root in the left lateral recess. Lateral
extension into the left neural foramen, potentially affecting the
left L4 nerve root as well.
2. Moderate left L5 foraminal stenosis related to disc osteophyte
and facet arthrosis.
3. Right foraminal disc protrusion at L3-4, contacting the exiting
right L3 nerve root.
4. Mild soft tissue edema within the lower paraspinous musculature,
suggesting muscular strain.
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
## 2018-10-01 ENCOUNTER — Encounter (HOSPITAL_COMMUNITY): Payer: Self-pay | Admitting: Psychiatry

## 2018-10-01 ENCOUNTER — Other Ambulatory Visit: Payer: Self-pay

## 2018-10-01 ENCOUNTER — Ambulatory Visit (INDEPENDENT_AMBULATORY_CARE_PROVIDER_SITE_OTHER): Payer: No Typology Code available for payment source | Admitting: Psychiatry

## 2018-10-01 DIAGNOSIS — F331 Major depressive disorder, recurrent, moderate: Secondary | ICD-10-CM

## 2018-10-01 MED ORDER — SERTRALINE HCL 50 MG PO TABS: ORAL_TABLET | ORAL | 1 refills | Status: DC

## 2018-10-01 NOTE — Patient Instructions (Signed)
1. Start sertraline 25 mg at night for week, then 50 mg night 2. Discontinue bupropion 3. Next appointment: 9/24 at 9 AM for 30 mins, video 4. Check TSH (thyroid test) at the upcoming visit with your primary care doctor

## 2018-10-12 ENCOUNTER — Telehealth (HOSPITAL_COMMUNITY): Payer: Self-pay

## 2018-10-12 ENCOUNTER — Telehealth (HOSPITAL_COMMUNITY): Payer: Self-pay | Admitting: *Deleted

## 2018-10-12 NOTE — Telephone Encounter (Signed)
Please see the other message I routed to you and contact the patient.

## 2018-10-12 NOTE — Telephone Encounter (Signed)
4th attempt & unable to reach patient MAIL BOX is FULL

## 2018-10-12 NOTE — Telephone Encounter (Signed)
Patient called 'stated he's having a reaction/problem with the medication prescribed & needs to speak with you. [phone]. Attempted x3 call back on preferred # left on VM & the mail box is full [phone]

## 2018-10-12 NOTE — Telephone Encounter (Signed)
4th attempt & unable to reach patient's MAIL BOX is FULL  PER PROVIDER: If he was able to tolerate sertraline 25 mg daily, advise him to stay on that dose. If he had side effect even from 25 mg daily, advise him to discontinue sertraline If he is interested, we can try venlafaxine after his symptoms resolve- side effect includes headache and hypertension. Let me know if he is interested so that I can order medication.

## 2018-10-12 NOTE — Telephone Encounter (Signed)
Direce, could you contact the patient regarding the call above. If he was able to tolerate sertraline 25 mg daily, advise him to stay on that dose. If he had side effect even from 25 mg daily, advise him to discontinue sertraline If he is interested, we can try venlafaxine after his symptoms resolve- side effect includes headache and hypertension. Let me know if he is interested so that I can order medication.

## 2018-10-12 NOTE — Telephone Encounter (Signed)
This patient reached out to me, I gave him the number to the [place] office. He states that the medication is making him worse and describes symptoms of akathisia. Please review and advise, thank you

## 2018-10-30 ENCOUNTER — Encounter: Payer: Self-pay | Admitting: Gastroenterology

## 2018-11-10 ENCOUNTER — Ambulatory Visit (AMBULATORY_SURGERY_CENTER): Payer: Self-pay

## 2018-11-10 ENCOUNTER — Other Ambulatory Visit: Payer: Self-pay

## 2018-11-10 VITALS — Temp 96.6°F | Ht 69.0 in | Wt 194.6 lb

## 2018-11-10 DIAGNOSIS — Z800 Family history of malignant neoplasm of digestive organs: Secondary | ICD-10-CM

## 2018-11-10 DIAGNOSIS — Z1211 Encounter for screening for malignant neoplasm of colon: Secondary | ICD-10-CM

## 2018-11-10 MED ORDER — NA SULFATE-K SULFATE-MG SULF 17.5-3.13-1.6 GM/177ML PO SOLN: 1.0000 | Freq: Once | ORAL | 0 refills | Status: AC

## 2018-11-10 NOTE — Progress Notes (Signed)
Denies allergies to eggs or soy products. Denies complication of anesthesia or sedation. Denies use of weight loss medication. Denies use of O2.   Emmi instructions given for colonoscopy.  A 15.00 coupon for Suprep was given to the patient.  

## 2018-11-10 NOTE — Progress Notes (Signed)
Virtual Visit via Video Note  I connected with Nicholas Ward on 11/12/18 at  9:00 AM EDT by a video enabled telemedicine application and verified that I am speaking with the correct person using two identifiers.   I discussed the limitations of evaluation and management by telemedicine and the availability of in person appointments. The patient expressed understanding and agreed to proceed.     I discussed the assessment and treatment plan with the patient. The patient was provided an opportunity to ask questions and all were answered. The patient agreed with the plan and demonstrated an understanding of the instructions.   The patient was advised to call back or seek an in-person evaluation if the symptoms worsen or if the condition fails to improve as anticipated.  I provided 25 minutes of non-face-to-face time during this encounter.   Nicholas Clay, MD    [facility] MD/PA/NP OP Progress Note  11/12/2018 9:29 AM Nicholas Ward  MRN:  [id number]  Chief Complaint:  Chief Complaint    Depression; Follow-up     HPI:  This is a follow-up appointment for depression.  He states that he has been satisfied with the result of sertraline. Although he did have intense anxiety two weeks after starting sertraline, it has been subsided. He returned to work and is taking PICC placement training. Although he was very nervous as it is after two months since the last placement, and it requires multi steps, he felt at ease after he completed one case. He believes that he is in a "good mindset," and overall feels "much happier." He reports better relationship with his wife (although he does not elaborate it this time). He enjoys spending more time with her, playing vide games and riding a bike.  He has started to eat healthy diet, and has joined a fitness club.  He has middle insomnia.  He may play video games at night, which involves some exercise. He is advised to limit blue light and exercise later in the  day.  He has better concentration.  He has good energy.  He denies SI.  His anxiety or panic attacks.  He denies alcohol use since the last visit.   Visit Diagnosis:    ICD-10-CM   1. MDD (major depressive disorder), recurrent, in partial remission ([place])  F33.41     Past Psychiatric History: Please see initial evaluation for full details. I have reviewed the history. No updates at this time.     Past Medical History:  Past Medical History:  Diagnosis Date  . Allergy   . Anxiety   . Depression   . Diabetes mellitus without complication ([place])   . GERD (gastroesophageal reflux disease)   . Hyperlipidemia   . Hypertension   . Urinary tract infection     Past Surgical History:  Procedure Laterality Date  . OPEN ANTERIOR SHOULDER RECONSTRUCTION    . SEPTOPLASTY  2000  . TONSILLECTOMY AND ADENOIDECTOMY      Family Psychiatric History: Please see initial evaluation for full details. I have reviewed the history. No updates at this time.     Family History:  Family History  Problem Relation Age of Onset  . Colon cancer Mother   . Hyperlipidemia Mother   . Heart disease Father   . Hypertension Father   . Diabetes Father   . Colon cancer Father   . Lung cancer Maternal Grandmother   . Alcohol abuse Maternal Grandfather   . Alcohol abuse Paternal Grandfather   .  Depression Sister   . Anxiety disorder Sister   . Rectal cancer Neg Hx   . Stomach cancer Neg Hx   . Esophageal cancer Neg Hx     Social History:  Social History   Socioeconomic History  . Marital status: Married    Spouse name: Not on file  . Number of children: 2  . Years of education: 76  . Highest education level: Not on file  Occupational History  . Occupation: Equities trader  Social Needs  . Financial resource strain: Not on file  . Food insecurity    Worry: Not on file    Inability: Not on file  . Transportation needs    Medical: Not on file    Non-medical: Not on file  Tobacco Use  .  Smoking status: Former Smoker    Packs/day: 0.25    Years: 18.00    Pack years: 4.50    Types: Cigarettes  . Smokeless tobacco: Never Used  Substance and Sexual Activity  . Alcohol use: No    Alcohol/week: 0.0 standard drinks  . Drug use: No  . Sexual activity: Not on file  Lifestyle  . Physical activity    Days per week: Not on file    Minutes per session: Not on file  . Stress: Not on file  Relationships  . Social Herbalist on phone: Not on file    Gets together: Not on file    Attends religious service: Not on file    Active member of club or organization: Not on file    Attends meetings of clubs or organizations: Not on file    Relationship status: Not on file  Other Topics Concern  . Not on file  Social History Narrative   Currently lives with his wife and children. 3 cats, 2 dogs; Fun: martial arts, motorcycle   Denies any religious beliefs effecting healthcare.     Allergies: No Known Allergies  Metabolic Disorder Labs: Lab Results  Component Value Date   HGBA1C 5.9 04/21/2014   No results found for: PROLACTIN Lab Results  Component Value Date   CHOL 173 04/21/2014   TRIG 154.0 (H) 04/21/2014   HDL 42.60 04/21/2014   CHOLHDL 4 04/21/2014   VLDL 30.8 04/21/2014   LDLCALC 100 (H) 04/21/2014   No results found for: TSH  Therapeutic Level Labs: No results found for: LITHIUM No results found for: VALPROATE No components found for:  CBMZ  Current Medications: Current Outpatient Medications  Medication Sig Dispense Refill  . hydrOXYzine (ATARAX/VISTARIL) 25 MG tablet Take 25 mg by mouth at bedtime as needed.    . sildenafil (VIAGRA) 50 MG tablet Take 50 mg by mouth daily as needed for erectile dysfunction.    . metFORMIN (GLUCOPHAGE) 500 MG tablet Take 500 mg by mouth 2 (two) times daily.    . Multiple Vitamin (MULTIVITAMIN) capsule Take 1 capsule by mouth daily.    . sertraline (ZOLOFT) 50 MG tablet Take 1 tablet (50 mg total) by mouth daily.  90 tablet 0   No current facility-administered medications for this visit.      Musculoskeletal: Strength & Muscle Tone: N/A Gait & Station: N/A Patient leans: N/A  Psychiatric Specialty Exam: Review of Systems  Psychiatric/Behavioral: Negative for depression, hallucinations, memory loss, substance abuse and suicidal ideas. The patient is nervous/anxious and has insomnia.   All other systems reviewed and are negative.   There were no vitals taken for this visit.There is no height  or weight on file to calculate BMI.  General Appearance: Fairly Groomed  Eye Contact:  Good  Speech:  Clear and Coherent  Volume:  Normal  Mood:  "better"  Affect:  Appropriate, Congruent and calmer  Thought Process:  Coherent  Orientation:  Full (Time, Place, and Person)  Thought Content: Logical   Suicidal Thoughts:  No  Homicidal Thoughts:  No  Memory:  Immediate;   Good  Judgement:  Good  Insight:  Good  Psychomotor Activity:  Normal  Concentration:  Concentration: Good and Attention Span: Good  Recall:  Good  Fund of Knowledge: Good  Language: Good  Akathisia:  No  Handed:  Right  AIMS (if indicated): not done  Assets:  Communication Skills Desire for Improvement  ADL's:  Intact  Cognition: WNL  Sleep:  Poor   Screenings:   Assessment and Plan:  Nicholas Ward is a 53 y.o. year old male with a history of depression, diabetes , who presents for follow up appointment for MDD (major depressive disorder), recurrent, in partial remission ([place])  # MDD in partial remission, recurrent without psychotic features Exam is notable for calmer affect, and there has been significant improvement in depressive symptoms after starting sertraline.  Psychosocial stressors includes marital conflict (improving), loss of his friend who was a father figure, and work as a Marine scientist.  Will continue current dose of sertraline to target depressive symptoms.  Discussed potential risk of sexual side effect; will  consider switching this medication to other antidepressant if any worsening in sexual side effect.  Discussed behavioral activation.   # Insomnia He complains of middle insomnia, which coincided with starting sertraline. Discussed sleep hygiene. He takes hydroxyzine prn prescribed by PCP.   Plan 1. Continue sertraline 50 mg daily 2. Continue hydroxyzine 25 mg daily as needed for anxiety (receives it from PCP) 3. Next appointment: in 3 months 4. TSH level checked by PCP; pending - obtain record from Dr. Woody Seller at the next visit - he sees a therapist and couple therapist  Past trials of medication: citalopram (sexual side effect), fluoxetine, bupropion (300 mg caused him horrible)  The patient demonstrates the following risk factors for suicide: Chronic risk factors for suicide include: psychiatric disorder of depression. Acute risk factors for suicide include: family or marital conflict and social withdrawal/isolation. Protective factors for this patient include: responsibility to others (children, family), coping skills and hope for the future. Considering these factors, the overall suicide risk at this point appears to be low. Patient is appropriate for outpatient follow up.  The duration of this appointment visit was 25 minutes of non face-to-face time with the patient.  Greater than 50% of this time was spent in counseling, explanation of  diagnosis, planning of further management, and coordination of care.  Nicholas Clay, MD 11/12/2018, 9:29 AM

## 2018-11-12 ENCOUNTER — Encounter (HOSPITAL_COMMUNITY): Payer: Self-pay | Admitting: Psychiatry

## 2018-11-12 ENCOUNTER — Other Ambulatory Visit: Payer: Self-pay

## 2018-11-12 ENCOUNTER — Ambulatory Visit (INDEPENDENT_AMBULATORY_CARE_PROVIDER_SITE_OTHER): Payer: No Typology Code available for payment source | Admitting: Psychiatry

## 2018-11-12 DIAGNOSIS — F3341 Major depressive disorder, recurrent, in partial remission: Secondary | ICD-10-CM | POA: Diagnosis not present

## 2018-11-12 MED ORDER — SERTRALINE HCL 50 MG PO TABS: 50.0000 mg | ORAL_TABLET | Freq: Every day | ORAL | 0 refills | Status: DC

## 2018-11-12 NOTE — Patient Instructions (Signed)
1. Continue sertraline 50 mg night 2. Continue hydroxyzine 25 mg daily as needed for anxiety  3. Next appointment: in 3 months

## 2018-11-13 ENCOUNTER — Encounter: Payer: Self-pay | Admitting: Gastroenterology

## 2018-11-24 ENCOUNTER — Telehealth: Payer: Self-pay

## 2018-11-24 NOTE — Telephone Encounter (Signed)
Covid-19 screening questions   Do you now or have you had a fever in the last 14 days? NO   Do you have any respiratory symptoms of shortness of breath or cough now or in the last 14 days? NO  Do you have any family members or close contacts with diagnosed or suspected Covid-19 in the past 14 days? NO  Have you been tested for Covid-19 and found to be positive? NO        

## 2018-11-25 ENCOUNTER — Other Ambulatory Visit: Payer: Self-pay | Admitting: Gastroenterology

## 2018-11-25 ENCOUNTER — Encounter: Payer: Self-pay | Admitting: Gastroenterology

## 2018-11-25 ENCOUNTER — Other Ambulatory Visit: Payer: Self-pay

## 2018-11-25 ENCOUNTER — Ambulatory Visit (AMBULATORY_SURGERY_CENTER): Payer: No Typology Code available for payment source | Admitting: Gastroenterology

## 2018-11-25 VITALS — BP 115/74 | HR 48 | Temp 98.2°F | Resp 13 | Ht 69.0 in | Wt 194.0 lb

## 2018-11-25 DIAGNOSIS — Z800 Family history of malignant neoplasm of digestive organs: Secondary | ICD-10-CM

## 2018-11-25 DIAGNOSIS — D123 Benign neoplasm of transverse colon: Secondary | ICD-10-CM

## 2018-11-25 DIAGNOSIS — D120 Benign neoplasm of cecum: Secondary | ICD-10-CM | POA: Diagnosis not present

## 2018-11-25 MED ORDER — SODIUM CHLORIDE 0.9 % IV SOLN: 500.0000 mL | Freq: Once | INTRAVENOUS | Status: DC

## 2018-11-25 NOTE — Patient Instructions (Signed)
YOU HAD AN ENDOSCOPIC PROCEDURE TODAY AT THE [facility]:   Refer to the procedure report that was given to you for any specific questions about what was found during the examination.  If the procedure report does not answer your questions, please call your gastroenterologist to clarify.  If you requested that your care partner not be given the details of your procedure findings, then the procedure report has been included in a sealed envelope for you to review at your convenience later.  YOU SHOULD EXPECT: Some feelings of bloating in the abdomen. Passage of more gas than usual.  Walking can help get rid of the air that was put into your GI tract during the procedure and reduce the bloating. If you had a lower endoscopy (such as a colonoscopy or flexible sigmoidoscopy) you may notice spotting of blood in your stool or on the toilet paper. If you underwent a bowel prep for your procedure, you may not have a normal bowel movement for a few days.  Please Note:  You might notice some irritation and congestion in your nose or some drainage.  This is from the oxygen used during your procedure.  There is no need for concern and it should clear up in a day or so.  SYMPTOMS TO REPORT IMMEDIATELY:   Following lower endoscopy (colonoscopy or flexible sigmoidoscopy):  Excessive amounts of blood in the stool  Significant tenderness or worsening of abdominal pains  Swelling of the abdomen that is new, acute  Fever of 100F or higher  For urgent or emergent issues, a gastroenterologist can be reached at any hour by calling [phone].   DIET:  We do recommend a small meal at first, but then you may proceed to your regular diet.  Drink plenty of fluids but you should avoid alcoholic beverages for 24 hours.  ACTIVITY:  You should plan to take it easy for the rest of today and you should NOT DRIVE or use heavy machinery until tomorrow (because of the sedation medicines used during the test).     FOLLOW UP: Our staff will call the number listed on your records 48-72 hours following your procedure to check on you and address any questions or concerns that you may have regarding the information given to you following your procedure. If we do not reach you, we will leave a message.  We will attempt to reach you two times.  During this call, we will ask if you have developed any symptoms of COVID 19. If you develop any symptoms (ie: fever, flu-like symptoms, shortness of breath, cough etc.) before then, please call [phone].  If you test positive for Covid 19 in the 2 weeks post procedure, please call and report this information to us.    If any biopsies were taken you will be contacted by phone or by letter within the next 1-3 weeks.  Please call us at [phone] if you have not heard about the biopsies in 3 weeks.    SIGNATURES/CONFIDENTIALITY: You and/or your care partner have signed paperwork which will be entered into your electronic medical record.  These signatures attest to the fact that that the information above on your After Visit Summary has been reviewed and is understood.  Full responsibility of the confidentiality of this discharge information lies with you and/or your care-partner. 

## 2018-11-25 NOTE — Progress Notes (Signed)
To PACU, VSS. Report to [email or web address] 

## 2018-11-25 NOTE — Progress Notes (Signed)
KA temp CW v/s   No changes in medical surgical history since previsit.

## 2018-11-25 NOTE — Op Note (Signed)
[place] Patient Name: Nicholas Ward Procedure Date: 11/25/2018 10:44 AM MRN: [id number] Endoscopist: Remo Lipps P. Havery Moros , MD Age: 53 Referring MD:  Date of Birth: [birth year] Gender: Male Account #: [id number] Procedure:                Colonoscopy Indications:              Screening in patient at increased risk: Family                            history of 1st-degree relative with colorectal                            cancer (mother age 16s), father with carcinoid of                            the bowel Medicines:                Monitored Anesthesia Care Procedure:                Pre-Anesthesia Assessment:                           - Prior to the procedure, a History and Physical                            was performed, and patient medications and                            allergies were reviewed. The patient's tolerance of                            previous anesthesia was also reviewed. The risks                            and benefits of the procedure and the sedation                            options and risks were discussed with the patient.                            All questions were answered, and informed consent                            was obtained. Prior Anticoagulants: The patient has                            taken no previous anticoagulant or antiplatelet                            agents. ASA Grade Assessment: II - A patient with                            mild systemic disease. After reviewing the risks  and benefits, the patient was deemed in                            satisfactory condition to undergo the procedure.                           After obtaining informed consent, the colonoscope                            was passed under direct vision. Throughout the                            procedure, the patient's blood pressure, pulse, and                            oxygen saturations were monitored continuously. The                             LOANER 0255 was introduced through the anus and                            advanced to the the cecum, identified by                            appendiceal orifice and ileocecal valve. The                            colonoscopy was performed without difficulty. The                            patient tolerated the procedure well. The quality                            of the bowel preparation was adequate. The                            ileocecal valve, appendiceal orifice, and rectum                            were photographed. Scope In: 10:50:49 AM Scope Out: 11:12:33 AM Scope Withdrawal Time: 0 hours 19 minutes 42 seconds  Total Procedure Duration: 0 hours 21 minutes 44 seconds  Findings:                 The perianal and digital rectal examinations were                            normal.                           A 4 mm polyp was found in the cecum. The polyp was                            sessile. The polyp was removed with a cold snare.  Resection and retrieval were complete.                           A 3 mm polyp was found in the hepatic flexure. The                            polyp was sessile. The polyp was removed with a                            cold snare. Resection and retrieval were complete.                           A diminutive polyp was found in the splenic                            flexure. The polyp was sessile, had difficulty                            grasping it with the snare. The polyp was removed                            with a cold biopsy forceps. Resection and retrieval                            were complete.                           A few small-mouthed diverticula were found in the                            sigmoid colon.                           Internal hemorrhoids were found during retroflexion.                           A large amount of liquid stool was found in the                             entire colon, making visualization difficult.                            Lavage of the area was performed using copious                            amounts of sterile water, resulting in clearance                            with good visualization.                           The exam was otherwise without abnormality. Complications:            No immediate complications. Estimated blood loss:  Minimal. Estimated Blood Loss:     Estimated blood loss was minimal. Impression:               - One 4 mm polyp in the cecum, removed with a cold                            snare. Resected and retrieved.                           - One 3 mm polyp at the hepatic flexure, removed                            with a cold snare. Resected and retrieved.                           - One diminutive polyp at the splenic flexure,                            removed with a cold biopsy forceps. Resected and                            retrieved.                           - Diverticulosis in the sigmoid colon.                           - Internal hemorrhoids.                           - Stool in the entire examined colon.                           - The examination was otherwise normal. Recommendation:           - Patient has a contact number available for                            emergencies. The signs and symptoms of potential                            delayed complications were discussed with the                            patient. Return to normal activities tomorrow.                            Written discharge instructions were provided to the                            patient.                           - Resume previous diet.                           -  Continue present medications.                           - Await pathology results. Remo Lipps P. Armbruster, MD 11/25/2018 11:16:51 AM This report has been signed electronically.

## 2018-11-25 NOTE — Progress Notes (Signed)
Called to room to assist during endoscopic procedure.  Patient ID and intended procedure confirmed with present staff. Received instructions for my participation in the procedure from the performing physician.  

## 2018-11-27 ENCOUNTER — Telehealth: Payer: Self-pay

## 2018-11-27 NOTE — Telephone Encounter (Signed)
No voicemail set up. Unable to leave message.

## 2019-02-16 ENCOUNTER — Ambulatory Visit (HOSPITAL_COMMUNITY): Payer: No Typology Code available for payment source | Admitting: Psychiatry

## 2019-03-03 ENCOUNTER — Other Ambulatory Visit: Payer: Self-pay

## 2019-03-03 ENCOUNTER — Encounter: Payer: Self-pay | Admitting: Psychiatry

## 2019-03-03 ENCOUNTER — Ambulatory Visit (INDEPENDENT_AMBULATORY_CARE_PROVIDER_SITE_OTHER): Payer: No Typology Code available for payment source | Admitting: Psychiatry

## 2019-03-03 DIAGNOSIS — F3342 Major depressive disorder, recurrent, in full remission: Secondary | ICD-10-CM

## 2019-03-03 MED ORDER — SERTRALINE HCL 50 MG PO TABS: 50.0000 mg | ORAL_TABLET | Freq: Every day | ORAL | 1 refills | Status: AC

## 2019-03-03 NOTE — Progress Notes (Signed)
[facility] MD OP Progress Note  Virtual Visit via Telephone Note  I connected with Nicholas Ward on 03/03/19 at  9:30 AM EST by telephone and verified that I am speaking with the correct person using two identifiers.   I discussed the limitations, risks, security and privacy concerns of performing an evaluation and management service by telephone and the availability of in person appointments. I also discussed with the patient that there may be a patient responsible charge related to this service. The patient expressed understanding and agreed to proceed.    03/03/2019 9:50 AM Nicholas Ward  MRN:  [id number]  Chief Complaint: " I was doing fine until about a week ago."  HPI: Patient reported that sertraline has helped his symptoms of depression anxiety significantly.  He stated he was doing really well until about a week ago when his daughter tested positive for Covid.  The family had come in contact with her and following that his wife had to be tested to.  He also underwent test and had to miss work because of that.  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  He was due for his second dose of COVID 19 vaccine but due to his test results pending he was unable to get it on the day he was planning to get it.  He is hoping to get it may be today so that in case he has bad after-effects he does not have to miss another weekend of work. All this has made feel anxious and stressed. He is feeling better today and thinks all this is temporary and he will be fine after this. His daughter is doing better now.   Visit Diagnosis:    ICD-10-CM   1. MDD (major depressive disorder), recurrent, in full remission ([place])  F33.42 sertraline (ZOLOFT) 50 MG tablet    Past Psychiatric History: depression, anxiety  Past Medical History:  Past Medical History:  Diagnosis Date  . Allergy   . Anxiety   . Depression   . Diabetes mellitus without complication ([place])   . GERD  (gastroesophageal reflux disease)   . Hyperlipidemia   . Hypertension   . Urinary tract infection     Past Surgical History:  Procedure Laterality Date  . OPEN ANTERIOR SHOULDER RECONSTRUCTION    . SEPTOPLASTY  2000  . TONSILLECTOMY AND ADENOIDECTOMY      Family Psychiatric History: see below  Family History:  Family History  Problem Relation Age of Onset  . Colon cancer Mother   . Hyperlipidemia Mother   . Heart disease Father   . Hypertension Father   . Diabetes Father   . Colon cancer Father   . Lung cancer Maternal Grandmother   . Alcohol abuse Maternal Grandfather   . Alcohol abuse Paternal Grandfather   . Depression Sister   . Anxiety disorder Sister   . Rectal cancer Neg Hx   . Stomach cancer Neg Hx   . Esophageal cancer Neg Hx     Social History:  Social History   Socioeconomic History  . Marital status: Married    Spouse name: Not on file  . Number of children: 2  . Years of education: 60  . Highest education level: Not on file  Occupational History  . Occupation: Equities trader  Tobacco Use  . Smoking status: Former Smoker    Packs/day: 0.25    Years: 18.00    Pack years: 4.50  Types: Cigarettes  . Smokeless tobacco: Never Used  Substance and Sexual Activity  . Alcohol use: No    Alcohol/week: 0.0 standard drinks  . Drug use: No  . Sexual activity: Not on file  Other Topics Concern  . Not on file  Social History Narrative   Currently lives with his wife and children. 3 cats, 2 dogs; Fun: martial arts, motorcycle   Denies any religious beliefs effecting healthcare.    Social Determinants of Health   Financial Resource Strain:   . Difficulty of Paying Living Expenses: Not on file  Food Insecurity:   . Worried About Charity fundraiser in the Last Year: Not on file  . Ran Out of Food in the Last Year: Not on file  Transportation Needs:   . Lack of Transportation (Medical): Not on file  . Lack of Transportation (Non-Medical): Not on  file  Physical Activity:   . Days of Exercise per Week: Not on file  . Minutes of Exercise per Session: Not on file  Stress:   . Feeling of Stress : Not on file  Social Connections:   . Frequency of Communication with Friends and Family: Not on file  . Frequency of Social Gatherings with Friends and Family: Not on file  . Attends Religious Services: Not on file  . Active Member of Clubs or Organizations: Not on file  . Attends Archivist Meetings: Not on file  . Marital Status: Not on file    Allergies: No Known Allergies  Metabolic Disorder Labs: Lab Results  Component Value Date   HGBA1C 5.9 04/21/2014   No results found for: PROLACTIN Lab Results  Component Value Date   CHOL 173 04/21/2014   TRIG 154.0 (H) 04/21/2014   HDL 42.60 04/21/2014   CHOLHDL 4 04/21/2014   VLDL 30.8 04/21/2014   LDLCALC 100 (H) 04/21/2014   No results found for: TSH  Therapeutic Level Labs: No results found for: LITHIUM No results found for: VALPROATE No components found for:  CBMZ  Current Medications: Current Outpatient Medications  Medication Sig Dispense Refill  . hydrOXYzine (ATARAX/VISTARIL) 25 MG tablet Take 25 mg by mouth at bedtime as needed.    . metFORMIN (GLUCOPHAGE) 500 MG tablet Take 500 mg by mouth 2 (two) times daily.    . Multiple Vitamin (MULTIVITAMIN) capsule Take 1 capsule by mouth daily.    . sertraline (ZOLOFT) 50 MG tablet Take 1 tablet (50 mg total) by mouth daily. 90 tablet 1  . sildenafil (VIAGRA) 50 MG tablet Take 50 mg by mouth daily as needed for erectile dysfunction.     No current facility-administered medications for this visit.     Musculoskeletal: Strength & Muscle Tone: unable to assess due to telemed visit Gait & Station: unable to assess due to telemed visit Patient leans: unable to assess due to telemed visit  Psychiatric Specialty Exam: Review of Systems  There were no vitals taken for this visit.There is no height or weight on file  to calculate BMI.  General Appearance: Well Groomed  Eye Contact:  Good  Speech:  Clear and Coherent and Normal Rate  Volume:  Normal  Mood:  Euthymic  Affect:  Congruent  Thought Process:  Goal Directed, Linear and Descriptions of Associations: Intact  Orientation:  Full (Time, Place, and Person)  Thought Content: Logical   Suicidal Thoughts:  No  Homicidal Thoughts:  No  Memory:  Recent;   Good Remote;   Good  Judgement:  Good  Insight:  Good  Psychomotor Activity:  Normal  Concentration:  Concentration: Good and Attention Span: Good  Recall:  Good  Fund of Knowledge: Good  Language: Good  Akathisia:  No  Handed:  Right  AIMS (if indicated): not done  Assets:  Communication Skills Desire for Improvement Financial Resources/Insurance Housing Social Support  ADL's:  Intact  Cognition: WNL  Sleep:  Good    Assessment and Plan: Pt reported doing well on sertraline, had a rough few days but is doing better now.  1. MDD (major depressive disorder), recurrent, in full remission ([place])  - sertraline (ZOLOFT) 50 MG tablet; Take 1 tablet (50 mg total) by mouth daily.  Dispense: 90 tablet; Refill: 1  Continue same medication regimen. Follow up in 6 months as per patient's request.   [place] Crane, MD 03/03/2019, 9:50 AM

## 2019-09-17 ENCOUNTER — Other Ambulatory Visit (HOSPITAL_COMMUNITY): Payer: Self-pay | Admitting: Internal Medicine

## 2022-03-05 ENCOUNTER — Encounter: Payer: Self-pay | Admitting: Gastroenterology

## 2024-01-17 ENCOUNTER — Encounter: Payer: Self-pay | Admitting: Gastroenterology
